# Patient Record
Sex: Female | Born: 1970 | Race: White | Hispanic: No | Smoking: Current every day smoker
Health system: Southern US, Community
[De-identification: ages and names within clinical notes are randomized; demographics above are authoritative.]

## PROBLEM LIST (undated history)

## (undated) HISTORY — PX: TUBAL LIGATION: SHX77

## (undated) HISTORY — PX: TONSILLECTOMY: SUR1361

---

## 2010-09-21 ENCOUNTER — Emergency Department (HOSPITAL_COMMUNITY)
Admission: EM | Admit: 2010-09-21 | Discharge: 2010-09-22 | Disposition: A | Discharge status: Home / Self Care | Payer: Self-pay | Attending: Emergency Medicine | Admitting: Emergency Medicine

## 2010-09-21 DIAGNOSIS — Z79899 Other long term (current) drug therapy: Secondary | ICD-10-CM | POA: Insufficient documentation

## 2010-09-21 DIAGNOSIS — N12 Tubulo-interstitial nephritis, not specified as acute or chronic: Secondary | ICD-10-CM | POA: Insufficient documentation

## 2010-09-21 LAB — CBC
HCT: 41.4 % (ref 36.0–46.0)
Hemoglobin: 13.8 g/dL (ref 12.0–15.0)
MCV: 89.2 fL (ref 78.0–100.0)
Platelets: 239 10*3/uL (ref 150–400)
RBC: 4.64 MIL/uL (ref 3.87–5.11)
WBC: 18.3 10*3/uL — ABNORMAL HIGH (ref 4.0–10.5)

## 2010-09-21 LAB — URINALYSIS, ROUTINE W REFLEX MICROSCOPIC
Bilirubin Urine: NEGATIVE
Glucose, UA: NEGATIVE mg/dL
Ketones, ur: 40 mg/dL — AB
Protein, ur: 30 mg/dL — AB
Urobilinogen, UA: 0.2 mg/dL (ref 0.0–1.0)

## 2010-09-21 LAB — DIFFERENTIAL
Eosinophils Absolute: 0 10*3/uL (ref 0.0–0.7)
Lymphocytes Relative: 7 % — ABNORMAL LOW (ref 12–46)
Lymphs Abs: 1.3 10*3/uL (ref 0.7–4.0)
Neutro Abs: 15.6 10*3/uL — ABNORMAL HIGH (ref 1.7–7.7)
Neutrophils Relative %: 85 % — ABNORMAL HIGH (ref 43–77)

## 2010-09-21 LAB — URINE MICROSCOPIC-ADD ON

## 2010-09-23 ENCOUNTER — Emergency Department (HOSPITAL_COMMUNITY)
Admission: EM | Admit: 2010-09-23 | Discharge: 2010-09-23 | Disposition: A | Discharge status: Home / Self Care | Payer: Self-pay | Attending: Emergency Medicine | Admitting: Emergency Medicine

## 2010-09-23 DIAGNOSIS — R509 Fever, unspecified: Secondary | ICD-10-CM | POA: Insufficient documentation

## 2010-09-23 DIAGNOSIS — R197 Diarrhea, unspecified: Secondary | ICD-10-CM | POA: Insufficient documentation

## 2010-09-23 DIAGNOSIS — R112 Nausea with vomiting, unspecified: Secondary | ICD-10-CM | POA: Insufficient documentation

## 2010-09-23 LAB — URINALYSIS, ROUTINE W REFLEX MICROSCOPIC
Glucose, UA: NEGATIVE mg/dL
Nitrite: POSITIVE — AB
Specific Gravity, Urine: 1.03 (ref 1.005–1.030)
pH: 5.5 (ref 5.0–8.0)

## 2010-09-23 LAB — URINE MICROSCOPIC-ADD ON

## 2010-09-23 LAB — DIFFERENTIAL
Basophils Absolute: 0 10*3/uL (ref 0.0–0.1)
Basophils Relative: 0 % (ref 0–1)
Eosinophils Relative: 0 % (ref 0–5)
Monocytes Absolute: 1.7 10*3/uL — ABNORMAL HIGH (ref 0.1–1.0)
Neutro Abs: 16.2 10*3/uL — ABNORMAL HIGH (ref 1.7–7.7)

## 2010-09-23 LAB — BASIC METABOLIC PANEL
Calcium: 10 mg/dL (ref 8.4–10.5)
GFR calc Af Amer: >60 mL/min (ref >60)
GFR calc non Af Amer: >60 mL/min (ref >60)
Sodium: 137 mEq/L (ref 135–145)

## 2010-09-23 LAB — CBC
Hemoglobin: 13.2 g/dL (ref 12.0–15.0)
MCHC: 33.4 g/dL (ref 30.0–36.0)
RDW: 13.7 % (ref 11.5–15.5)

## 2010-09-24 LAB — URINE CULTURE: Culture  Setup Time: 201205170111

## 2010-09-25 LAB — URINE CULTURE
Colony Count: >=100000
Culture  Setup Time: 201205180059

## 2017-08-25 ENCOUNTER — Encounter (HOSPITAL_COMMUNITY): Payer: Self-pay

## 2017-08-25 ENCOUNTER — Other Ambulatory Visit: Payer: Self-pay

## 2017-08-25 ENCOUNTER — Emergency Department (HOSPITAL_COMMUNITY)
Admission: EM | Admit: 2017-08-25 | Discharge: 2017-08-25 | Disposition: A | Discharge status: Home / Self Care | Payer: Self-pay | Attending: Emergency Medicine | Admitting: Emergency Medicine

## 2017-08-25 DIAGNOSIS — R300 Dysuria: Secondary | ICD-10-CM | POA: Insufficient documentation

## 2017-08-25 DIAGNOSIS — F17210 Nicotine dependence, cigarettes, uncomplicated: Secondary | ICD-10-CM | POA: Insufficient documentation

## 2017-08-25 DIAGNOSIS — R1030 Lower abdominal pain, unspecified: Secondary | ICD-10-CM | POA: Insufficient documentation

## 2017-08-25 DIAGNOSIS — R102 Pelvic and perineal pain: Secondary | ICD-10-CM

## 2017-08-25 LAB — URINALYSIS, ROUTINE W REFLEX MICROSCOPIC
Bacteria, UA: NONE SEEN
Bilirubin Urine: NEGATIVE
Glucose, UA: NEGATIVE mg/dL
KETONES UR: NEGATIVE mg/dL
Nitrite: NEGATIVE
PROTEIN: NEGATIVE mg/dL
Specific Gravity, Urine: 1.016 (ref 1.005–1.030)
pH: 6 (ref 5.0–8.0)

## 2017-08-25 LAB — PREGNANCY, URINE: PREG TEST UR: NEGATIVE

## 2017-08-25 MED ORDER — CEPHALEXIN 500 MG PO CAPS: 500.0000 mg | ORAL_CAPSULE | Freq: Two times a day (BID) | ORAL | 0 refills | Status: AC

## 2017-08-25 NOTE — ED Provider Notes (Signed)
[facility] EMERGENCY DEPARTMENT Provider Note   CSN: [id number] Arrival date & time: 08/25/17  1028     History   Chief Complaint Chief Complaint  Patient presents with  . Dysuria    HPI Audrey Rios is a 47 y.o. female presenting for evaluation of dysuria and lower abdominal cramping.  Patient states for the past 3 days, she has had dysuria and lower abdominal cramping.  Cramping began last week when she is having her period.  It is suprapubic.  It is intermittent, not severe.  She reports dysuria that began 3 days ago, as well as urinary frequency.  She denies hematuria.  She denies fevers, chills, nausea, vomiting, abdominal pain, flank pain.  She states this feels similar to when she had a UTI in the past.  Her UTIs have responded well to Keflex.  She denies other medical problems, does not take medications daily.  She denies vaginal discharge or concern for STDs.  HPI  History reviewed. No pertinent past medical history.  There are no active problems to display for this patient.   Past Surgical History:  Procedure Laterality Date  . TONSILLECTOMY    . TUBAL LIGATION       OB History   None      Home Medications    Prior to Admission medications   Medication Sig Start Date End Date Taking? Authorizing Provider  cephALEXin (KEFLEX) 500 MG capsule Take 1 capsule (500 mg total) by mouth 2 (two) times daily for 5 days. 08/25/17 08/30/17  Evolett Somarriba, PA-C    Family History No family history on file.  Social History Social History   Tobacco Use  . Smoking status: Current Every Day Smoker    Packs/day: 0.50    Types: Cigarettes  Substance Use Topics  . Alcohol use: Not Currently  . Drug use: Not Currently     Allergies   Erythromycin   Review of Systems Review of Systems  Constitutional: Negative for chills and fever.  Genitourinary: Positive for dysuria and frequency.     Physical Exam Updated Vital Signs BP (!) 175/87 (BP Location:  Right Arm)   Pulse 70   Temp 98.4 F (36.9 C) (Oral)   Resp 16   Ht 5\' 2"  (1.575 m)   Wt 92.1 kg (203 lb)   LMP 08/20/2017   SpO2 100%   BMI 37.13 kg/m   Physical Exam  Constitutional: She is oriented to person, place, and time. She appears well-developed and well-nourished. No distress.  HENT:  Head: Normocephalic and atraumatic.  Eyes: EOM are normal.  Neck: Normal range of motion.  Cardiovascular: Normal rate, regular rhythm and intact distal pulses.  Pulmonary/Chest: Effort normal and breath sounds normal. No respiratory distress. She has no wheezes.  Abdominal: Soft. She exhibits no distension and no mass. There is no tenderness. There is no rebound and no guarding.  No tenderness to palpation of the abdomen.  Soft without rigidity, guarding, or distention.  Musculoskeletal: Normal range of motion.  Neurological: She is alert and oriented to person, place, and time.  Skin: Skin is warm. No rash noted.  Psychiatric: She has a normal mood and affect.  Nursing note and vitals reviewed.    ED Treatments / Results  Labs (all labs ordered are listed, but only abnormal results are displayed) Labs Reviewed  URINALYSIS, ROUTINE W REFLEX MICROSCOPIC - Abnormal; Notable for the following components:      Result Value   APPearance CLOUDY (*)  Hgb urine dipstick LARGE (*)    Leukocytes, UA LARGE (*)    Squamous Epithelial / LPF 6-30 (*)    Non Squamous Epithelial 0-5 (*)    All other components within normal limits  PREGNANCY, URINE    EKG None  Radiology No results found.  Procedures Procedures (including critical care time)  Medications Ordered in ED Medications - No data to display   Initial Impression / Assessment and Plan / ED Course  I have reviewed the triage vital signs and the nursing notes.  Pertinent labs & imaging results that were available during my care of the patient were reviewed by me and considered in my medical decision making (see chart for  details).     Patient presenting for evaluation of dysuria and suprapubic cramping.  Physical exam reassuring, patient is afebrile not tachycardic.  She appears nontoxic.  Urine positive for leuks.  Patient without vaginal discharge, does not want to be tested for STDs or other vaginal infections today.  As urine is possibly positive for UTI, and patient is with symptoms, will treat with Keflex.  Doubt torsion, PID, or intra-abdominal infection.  Discussed follow-up with health department if symptoms are not improving with antibiotics.  At this time, patient appears safe for discharge.  Return precautions given.  Patient states she understands and agrees to plan.   Final Clinical Impressions(s) / ED Diagnoses   Final diagnoses:  Dysuria  Suprapubic cramping    ED Discharge Orders        Ordered    cephALEXin (KEFLEX) 500 MG capsule  2 times daily     08/25/17 1209       Akari Crysler, PA-C 08/25/17 1245    Bethann Berkshire, MD 08/29/17 0730

## 2017-08-25 NOTE — Discharge Instructions (Addendum)
Take antibiotics as prescribed.  Take the entire course, even if your symptoms improve. Make sure you are staying well-hydrated with water.  Try to decrease her caffeine intake. Use Tylenol or ibuprofen as needed for pain or cramping.  You may also try heating pads for cramping. Follow-up with health department or at urgent care if your symptoms are not improving with medication. Return to the emergency room if you develop high fevers, persistent vomiting, severe pain, vaginal bleeding, or any new or concerning symptoms.

## 2017-08-25 NOTE — ED Triage Notes (Signed)
Pt reports painful urination at the completion of urination.This began yesterday. Prior to this pt had abd cramps but attributed this to being on her period. Denies blood or vaginal discharge

## 2018-11-21 ENCOUNTER — Other Ambulatory Visit: Payer: Self-pay

## 2018-11-21 ENCOUNTER — Ambulatory Visit
Admission: EM | Admit: 2018-11-21 | Discharge: 2018-11-21 | Disposition: A | Discharge status: Home / Self Care | Payer: Self-pay | Attending: Emergency Medicine | Admitting: Emergency Medicine

## 2018-11-21 ENCOUNTER — Ambulatory Visit (INDEPENDENT_AMBULATORY_CARE_PROVIDER_SITE_OTHER): Payer: Self-pay

## 2018-11-21 DIAGNOSIS — R109 Unspecified abdominal pain: Secondary | ICD-10-CM

## 2018-11-21 DIAGNOSIS — R062 Wheezing: Secondary | ICD-10-CM

## 2018-11-21 DIAGNOSIS — S29012A Strain of muscle and tendon of back wall of thorax, initial encounter: Secondary | ICD-10-CM

## 2018-11-21 DIAGNOSIS — R51 Headache: Secondary | ICD-10-CM

## 2018-11-21 DIAGNOSIS — F17210 Nicotine dependence, cigarettes, uncomplicated: Secondary | ICD-10-CM

## 2018-11-21 DIAGNOSIS — R05 Cough: Secondary | ICD-10-CM

## 2018-11-21 DIAGNOSIS — R319 Hematuria, unspecified: Secondary | ICD-10-CM

## 2018-11-21 DIAGNOSIS — R0789 Other chest pain: Secondary | ICD-10-CM

## 2018-11-21 LAB — POCT URINALYSIS DIP (MANUAL ENTRY)
Bilirubin, UA: NEGATIVE
Glucose, UA: NEGATIVE mg/dL
Ketones, POC UA: NEGATIVE mg/dL
Leukocytes, UA: NEGATIVE
Nitrite, UA: NEGATIVE
Protein Ur, POC: NEGATIVE mg/dL
Spec Grav, UA: 1.025 (ref 1.010–1.025)
Urobilinogen, UA: 0.2 E.U./dL
pH, UA: 5.5 (ref 5.0–8.0)

## 2018-11-21 MED ORDER — CYCLOBENZAPRINE HCL 10 MG PO TABS: 10.0000 mg | ORAL_TABLET | Freq: Every day | ORAL | 0 refills | Status: AC

## 2018-11-21 NOTE — Discharge Instructions (Signed)
Chest x-ray showed possible bronchitis, but no pneumonia, fluid in the lungs, or collapse lung. Patient declines prednisone and inhaler today Urine did not show signs of infection just trace blood.  This could be a sign of kidney stone.  We will send urine out to culture and notify you of abnormal results.   Symptoms most likely musculoskeletal in nature.   Continue conservative management of rest, ice, heat, and gentle stretches Take OTC ibuprofen 600-800mg  twice daily.  You may take tylenol in between for break-through pain Take cyclobenzaprine at nighttime for symptomatic relief. Avoid driving or operating heavy machinery while using medication. Follow up with PCP if symptoms persist.  I have attached a list of local PCPs in the area that you may follow up with if you do not already have one.   Return or go to the ER if you have any new or worsening symptoms (fever, chills, cough, shortness of breath, chest pain, abdominal pain, changes in bowel or bladder habits, blood in urine, loss of bowel or bladder function, etc...)

## 2018-11-21 NOTE — ED Triage Notes (Signed)
Pt has had left sided flank pain since Monday and also c/o headache

## 2018-11-21 NOTE — ED Provider Notes (Signed)
[place]   [id number] 11/21/18 Arrival Time: 12  CC: LT flank pain  SUBJECTIVE: History from: patient. Brooklinn S Krog is a 48 y.o. female who presents with complaint of left flank pain x 2 days.  Does admit to lifting up grand child who is 63 years old, 2-3 days ago.  Localizes pain to left flank.  Describes as intermittent, dull and sharp in character.  Has tried OTC medications like tylenol and goody powder without relief.  Symptoms made worse with standing in certain positions.  Complains of sinus HA, and mild cough. Denies fever, chills, rhinorrhea, congestion, sore throat, SOB, chest pressure, erythema, ecchymosis, effusion, abdominal pain, changes in bowel or bladder function.      Admits to smoking tobacco.  1PPD x 30+ years  ROS: As per HPI.  History reviewed. No pertinent past medical history. Past Surgical History:  Procedure Laterality Date  . TONSILLECTOMY    . TUBAL LIGATION     Allergies  Allergen Reactions  . Erythromycin    No current facility-administered medications on file prior to encounter.    No current outpatient medications on file prior to encounter.   Social History   Socioeconomic History  . Marital status: Legally Separated    Spouse name: Not on file  . Number of children: Not on file  . Years of education: Not on file  . Highest education level: Not on file  Occupational History  . Not on file  Social Needs  . Financial resource strain: Not on file  . Food insecurity    Worry: Not on file    Inability: Not on file  . Transportation needs    Medical: Not on file    Non-medical: Not on file  Tobacco Use  . Smoking status: Current Every Day Smoker    Packs/day: 0.50    Types: Cigarettes  Substance and Sexual Activity  . Alcohol use: Not Currently  . Drug use: Not Currently  . Sexual activity: Not on file  Lifestyle  . Physical activity    Days per week: Not on file    Minutes per session: Not on file  . Stress: Not on  file  Relationships  . Social Herbalist on phone: Not on file    Gets together: Not on file    Attends religious service: Not on file    Active member of club or organization: Not on file    Attends meetings of clubs or organizations: Not on file    Relationship status: Not on file  . Intimate partner violence    Fear of current or ex partner: Not on file    Emotionally abused: Not on file    Physically abused: Not on file    Forced sexual activity: Not on file  Other Topics Concern  . Not on file  Social History Narrative  . Not on file   History reviewed. No pertinent family history.  OBJECTIVE:  Vitals:   11/21/18 1323  BP: (!) 143/89  Pulse: 71  Resp: 16  Temp: 98.6 F (37 C)  SpO2: 97%    General appearance: ALERT; in no acute distress.  Head: NCAT ENT: PERRL, EOMI grossly; nares patent without rhinorrhea; oropharynx clear Lungs: Diffuse expiratory wheezes heard bilaterally, but more prominent over left lung field CV: RRR Musculoskeletal: Back Inspection: Skin warm, dry, clear and intact without obvious erythema, effusion, or ecchymosis.  Palpation: NTTP over midline or paravertebral muscles; no CVA tenderness; unable to  reproduce symptoms on exam ROM: FROM active and passive Skin: warm and dry Neurologic: Ambulates without difficulty Psychological: alert and cooperative; normal mood and affect  DIAGNOSTIC STUDIES:  Dg Chest 2 View  Result Date: 11/21/2018 CLINICAL DATA:  Left lower posterior chest pain over the last 3 days. Wheezing and cough. EXAM: CHEST - 2 VIEW COMPARISON:  CT 06/22/2015 FINDINGS: Heart size is normal. Mediastinal shadows are normal. Question mild bronchial thickening but no infiltrate, mass, effusion or collapse. No significant bone finding. IMPRESSION: Possible bronchitis.  No consolidation or collapse. Electronically Signed   By: Paulina FusiMark  Shogry M.D.   On: 11/21/2018 14:25    LABS:  Results for orders placed or performed during  the hospital encounter of 11/21/18 (from the past 24 hour(s))  POCT urinalysis dipstick     Status: Abnormal   Collection Time: 11/21/18  1:35 PM  Result Value Ref Range   Color, UA yellow yellow   Clarity, UA clear clear   Glucose, UA negative negative mg/dL   Bilirubin, UA negative negative   Ketones, POC UA negative negative mg/dL   Spec Grav, UA [id number] - 1.025   Blood, UA trace-intact (A) negative   pH, UA 5.5 5.0 - 8.0   Protein Ur, POC negative negative mg/dL   Urobilinogen, UA 0.2 0.2 or 1.0 E.U./dL   Nitrite, UA Negative Negative   Leukocytes, UA Negative Negative     ASSESSMENT & PLAN:  1. Left flank pain   2. Wheezes   3. Hematuria, unspecified type   4. Strain of muscle and tendon of back wall of thorax, initial encounter     Meds ordered this encounter  Medications  . cyclobenzaprine (FLEXERIL) 10 MG tablet    Sig: Take 1 tablet (10 mg total) by mouth at bedtime.    Dispense:  12 tablet    Refill:  0    Order Specific Question:   Supervising Provider    Answer:   Eustace MooreELSON, YVONNE SUE [[id number]]   Chest x-ray showed possible bronchitis, but no pneumonia, fluid in the lungs, or collapse lung. Patient declines prednisone and inhaler today Urine did not show signs of infection just trace blood.  This could be a sign of kidney stone.  We will send urine out to culture and notify you of abnormal results.   Symptoms most likely musculoskeletal in nature.   Continue conservative management of rest, ice, heat, and gentle stretches Take OTC ibuprofen 600-800mg  twice daily.  You may take tylenol in between for break-through pain Take cyclobenzaprine at nighttime for symptomatic relief. Avoid driving or operating heavy machinery while using medication. Follow up with PCP if symptoms persist.  I have attached a list of local PCPs in the area that you may follow up with if you do not already have one.   Return or go to the ER if you have any new or worsening symptoms  (fever, chills, cough, shortness of breath, chest pain, abdominal pain, changes in bowel or bladder habits, blood in urine, loss of bowel or bladder function, etc...)   Reviewed expectations re: course of current medical issues. Questions answered. Outlined signs and symptoms indicating need for more acute intervention. Patient verbalized understanding. After Visit Summary given.    Rennis HardingWurst, Skylen Danielsen, PA-C 11/21/18 1443

## 2019-10-22 ENCOUNTER — Encounter: Payer: Self-pay | Admitting: Emergency Medicine

## 2019-10-22 ENCOUNTER — Ambulatory Visit
Admission: EM | Admit: 2019-10-22 | Discharge: 2019-10-22 | Disposition: A | Discharge status: Home / Self Care | Payer: Self-pay | Attending: Emergency Medicine | Admitting: Emergency Medicine

## 2019-10-22 ENCOUNTER — Other Ambulatory Visit: Payer: Self-pay

## 2019-10-22 DIAGNOSIS — R062 Wheezing: Secondary | ICD-10-CM

## 2019-10-22 DIAGNOSIS — H00012 Hordeolum externum right lower eyelid: Secondary | ICD-10-CM

## 2019-10-22 DIAGNOSIS — H00015 Hordeolum externum left lower eyelid: Secondary | ICD-10-CM

## 2019-10-22 DIAGNOSIS — J309 Allergic rhinitis, unspecified: Secondary | ICD-10-CM

## 2019-10-22 DIAGNOSIS — R059 Cough, unspecified: Secondary | ICD-10-CM

## 2019-10-22 DIAGNOSIS — R05 Cough: Secondary | ICD-10-CM

## 2019-10-22 MED ORDER — PREDNISONE 10 MG PO TABS: 20.0000 mg | ORAL_TABLET | Freq: Every day | ORAL | 0 refills | Status: DC

## 2019-10-22 MED ORDER — FLUTICASONE PROPIONATE 50 MCG/ACT NA SUSP: 1.0000 | Freq: Every day | NASAL | 0 refills | Status: AC

## 2019-10-22 MED ORDER — OFLOXACIN 0.3 % OT SOLN: 5.0000 [drp] | Freq: Every day | OTIC | 0 refills | Status: DC

## 2019-10-22 MED ORDER — CETIRIZINE HCL 10 MG PO TABS: 10.0000 mg | ORAL_TABLET | Freq: Every day | ORAL | 0 refills | Status: AC

## 2019-10-22 MED ORDER — BENZONATATE 100 MG PO CAPS: 100.0000 mg | ORAL_CAPSULE | Freq: Three times a day (TID) | ORAL | 0 refills | Status: DC

## 2019-10-22 NOTE — ED Triage Notes (Addendum)
Cough, nasal congestion and sinus pressure x 2 weeks. Swelling under LT eye, pt reports she think it is a stye.  No fever.  Pt states she has trouble with allergies as well.

## 2019-10-22 NOTE — Discharge Instructions (Addendum)
Get plenty of rest and push fluids Tessalon Perles prescribed for cough zyrtec for nasal congestion, runny nose, and/or sore throat flonase for nasal congestion and runny nose Prednisone prescribed for wheezing Use medications daily for symptom relief Use OTC medications like ibuprofen or tylenol as needed fever or pain Call or go to the ED if you have any new or worsening symptoms such as fever, worsening cough, shortness of breath, chest tightness, chest pain, turning blue, changes in mental status, etc...   STYE: Continue warm compresses at home.  Soak a wash cloth in warm (not scalding) water and place it over the eyes. As the wash cloth cools, it should be rewarmed and replaced for a total of 5 to 10 minutes of soaking time. Warm compresses should be applied two to four times a day as long as the patient has symptoms Perform lid washing: Either warm water or very dilute baby shampoo can be placed on a clean wash cloth, gauze pad, or cotton swab. Then be advised to gently clean along the lashes and lid margin to remove the accumulated material with care to avoid contacting the ocular surface. If shampoo is used, thorough rinsing is recommended. Vigorous washing should be avoided, as it may cause more irritation.  Prescribed for Ofloxacin eyedrops.  Apply  until symptomatic improvement

## 2019-10-22 NOTE — ED Provider Notes (Addendum)
[place]   [id number] 10/22/19 Arrival Time: 0916   Chief Complaint  Patient presents with  . Facial Pain     SUBJECTIVE: History from: patient.  Audrey Rios is a 49 y.o. female who presents to the urgent care for complaint of cough, nasal congestion and sinus pressure and clear nasal dissharge for the past 2 weeks.  Denies sick exposure to COVID, flu or strep.  Denies recent travel.  Has tried OTC medication without relief.  Symptoms are made worse with lying down.  Report previous symptoms in the past.   Denies fever, chills, fatigue,  sore throat, SOB, wheezing, chest pain, nausea, changes in bowel or bladder habits.    She is also complaining of a stye in the left eye.  Received flu shot this year: no.  ROS: As per HPI.  All other pertinent ROS negative.     History reviewed. No pertinent past medical history. Past Surgical History:  Procedure Laterality Date  . TONSILLECTOMY    . TUBAL LIGATION     Allergies  Allergen Reactions  . Erythromycin    No current facility-administered medications on file prior to encounter.   Current Outpatient Medications on File Prior to Encounter  Medication Sig Dispense Refill  . cyclobenzaprine (FLEXERIL) 10 MG tablet Take 1 tablet (10 mg total) by mouth at bedtime. 12 tablet 0   Social History   Socioeconomic History  . Marital status: Legally Separated    Spouse name: Not on file  . Number of children: Not on file  . Years of education: Not on file  . Highest education level: Not on file  Occupational History  . Not on file  Tobacco Use  . Smoking status: Current Every Day Smoker    Packs/day: 0.50    Types: Cigarettes  . Smokeless tobacco: Never Used  Substance and Sexual Activity  . Alcohol use: Not Currently  . Drug use: Not Currently  . Sexual activity: Not on file  Other Topics Concern  . Not on file  Social History Narrative  . Not on file   Social Determinants of Health   Financial  Resource Strain:   . Difficulty of Paying Living Expenses:   Food Insecurity:   . Worried About Charity fundraiser in the Last Year:   . Arboriculturist in the Last Year:   Transportation Needs:   . Film/video editor (Medical):   Marland Kitchen Lack of Transportation (Non-Medical):   Physical Activity:   . Days of Exercise per Week:   . Minutes of Exercise per Session:   Stress:   . Feeling of Stress :   Social Connections:   . Frequency of Communication with Friends and Family:   . Frequency of Social Gatherings with Friends and Family:   . Attends Religious Services:   . Active Member of Clubs or Organizations:   . Attends Archivist Meetings:   Marland Kitchen Marital Status:   Intimate Partner Violence:   . Fear of Current or Ex-Partner:   . Emotionally Abused:   Marland Kitchen Physically Abused:   . Sexually Abused:    Family History  Problem Relation Age of Onset  . Cancer Mother   . Hypertension Father     OBJECTIVE:  Vitals:   10/22/19 0926 10/22/19 0929  BP:  (!) 160/69  Pulse:  81  Resp:  17  Temp:  98.7 F (37.1 C)  TempSrc:  Oral  SpO2:  94%  Weight: 183 lb (  83 kg)   Height: 5\' 2"  (1.575 m)      Physical Exam Vitals and nursing note reviewed.  Constitutional:      General: She is not in acute distress.    Appearance: Normal appearance. She is normal weight. She is not ill-appearing, toxic-appearing or diaphoretic.  HENT:     Head: Normocephalic.     Right Ear: Tympanic membrane, ear canal and external ear normal. There is no impacted cerumen.     Left Ear: Tympanic membrane, ear canal and external ear normal. There is no impacted cerumen.     Nose: Congestion and rhinorrhea present.     Right Sinus: Maxillary sinus tenderness present.     Left Sinus: Maxillary sinus tenderness present.     Mouth/Throat:     Mouth: Mucous membranes are moist.     Pharynx: Oropharynx is clear. No oropharyngeal exudate or posterior oropharyngeal erythema.  Eyes:     General: Lids are  normal. Vision grossly intact. Gaze aligned appropriately.        Right eye: No hordeolum.        Left eye: Hordeolum present. Cardiovascular:     Rate and Rhythm: Normal rate and regular rhythm.     Pulses: Normal pulses.     Heart sounds: Normal heart sounds. No murmur heard.  No friction rub. No gallop.   Pulmonary:     Effort: Pulmonary effort is normal. No respiratory distress.     Breath sounds: No stridor. Wheezing present. No rhonchi or rales.  Chest:     Chest wall: No tenderness.  Neurological:     Mental Status: She is alert and oriented to person, place, and time.     LABS:  No results found for this or any previous visit (from the past 24 hour(s)).   ASSESSMENT & PLAN:  1. Allergic sinusitis   2. Wheezing   3. Cough   4. Hordeolum externum of left lower eyelid     Meds ordered this encounter  Medications  . fluticasone (FLONASE) 50 MCG/ACT nasal spray    Sig: Place 1 spray into both nostrils daily for 14 days.    Dispense:  16 g    Refill:  0  . cetirizine (ZYRTEC ALLERGY) 10 MG tablet    Sig: Take 1 tablet (10 mg total) by mouth daily.    Dispense:  30 tablet    Refill:  0  . predniSONE (DELTASONE) 10 MG tablet    Sig: Take 2 tablets (20 mg total) by mouth daily.    Dispense:  15 tablet    Refill:  0  . benzonatate (TESSALON) 100 MG capsule    Sig: Take 1 capsule (100 mg total) by mouth every 8 (eight) hours.    Dispense:  30 capsule    Refill:  0  . ofloxacin (FLOXIN) 0.3 % OTIC solution    Sig: Place 5 drops into the right ear daily.    Dispense:  5 mL    Refill:  0   Discharge instructions  Get plenty of rest and push fluids Tessalon Perles prescribed for cough zyrtec for nasal congestion, runny nose, and/or sore throat flonase for nasal congestion and runny nose Prednisone prescribed for wheezing Use medications daily for symptom relief Use OTC medications like ibuprofen or tylenol as needed fever or pain Call or go to the ED if you have  any new or worsening symptoms such as fever, worsening cough, shortness of breath, chest tightness, chest pain, turning  blue, changes in mental status, etc...   STYE: Continue warm compresses at home.  Soak a wash cloth in warm (not scalding) water and place it over the eyes. As the wash cloth cools, it should be rewarmed and replaced for a total of 5 to 10 minutes of soaking time. Warm compresses should be applied two to four times a day as long as the patient has symptoms Perform lid washing: Either warm water or very dilute baby shampoo can be placed on a clean wash cloth, gauze pad, or cotton swab. Then be advised to gently clean along the lashes and lid margin to remove the accumulated material with care to avoid contacting the ocular surface. If shampoo is used, thorough rinsing is recommended. Vigorous washing should be avoided, as it may cause more irritation.  Prescribed for Ofloxacin eyedrops.  Apply  until symptomatic improvement   Reviewed expectations re: course of current medical issues. Questions answered. Outlined signs and symptoms indicating need for more acute intervention. Patient verbalized understanding. After Visit Summary given.    Note: This document was prepared using Dragon voice recognition software and may include unintentional dictation errors.        Durward Parcel, FNP 10/22/19 0948    Durward Parcel, FNP 10/22/19 0954    Durward Parcel, FNP 10/22/19 [phone]

## 2020-11-25 IMAGING — DX CHEST - 2 VIEW
2 series · 2 of 2 positions shown · non-contrast
Comparison: CT 06/22/2015

CLINICAL DATA: Left lower posterior chest pain over the last 3
days. Wheezing and cough.

EXAM:
CHEST - 2 VIEW

[chest pa]
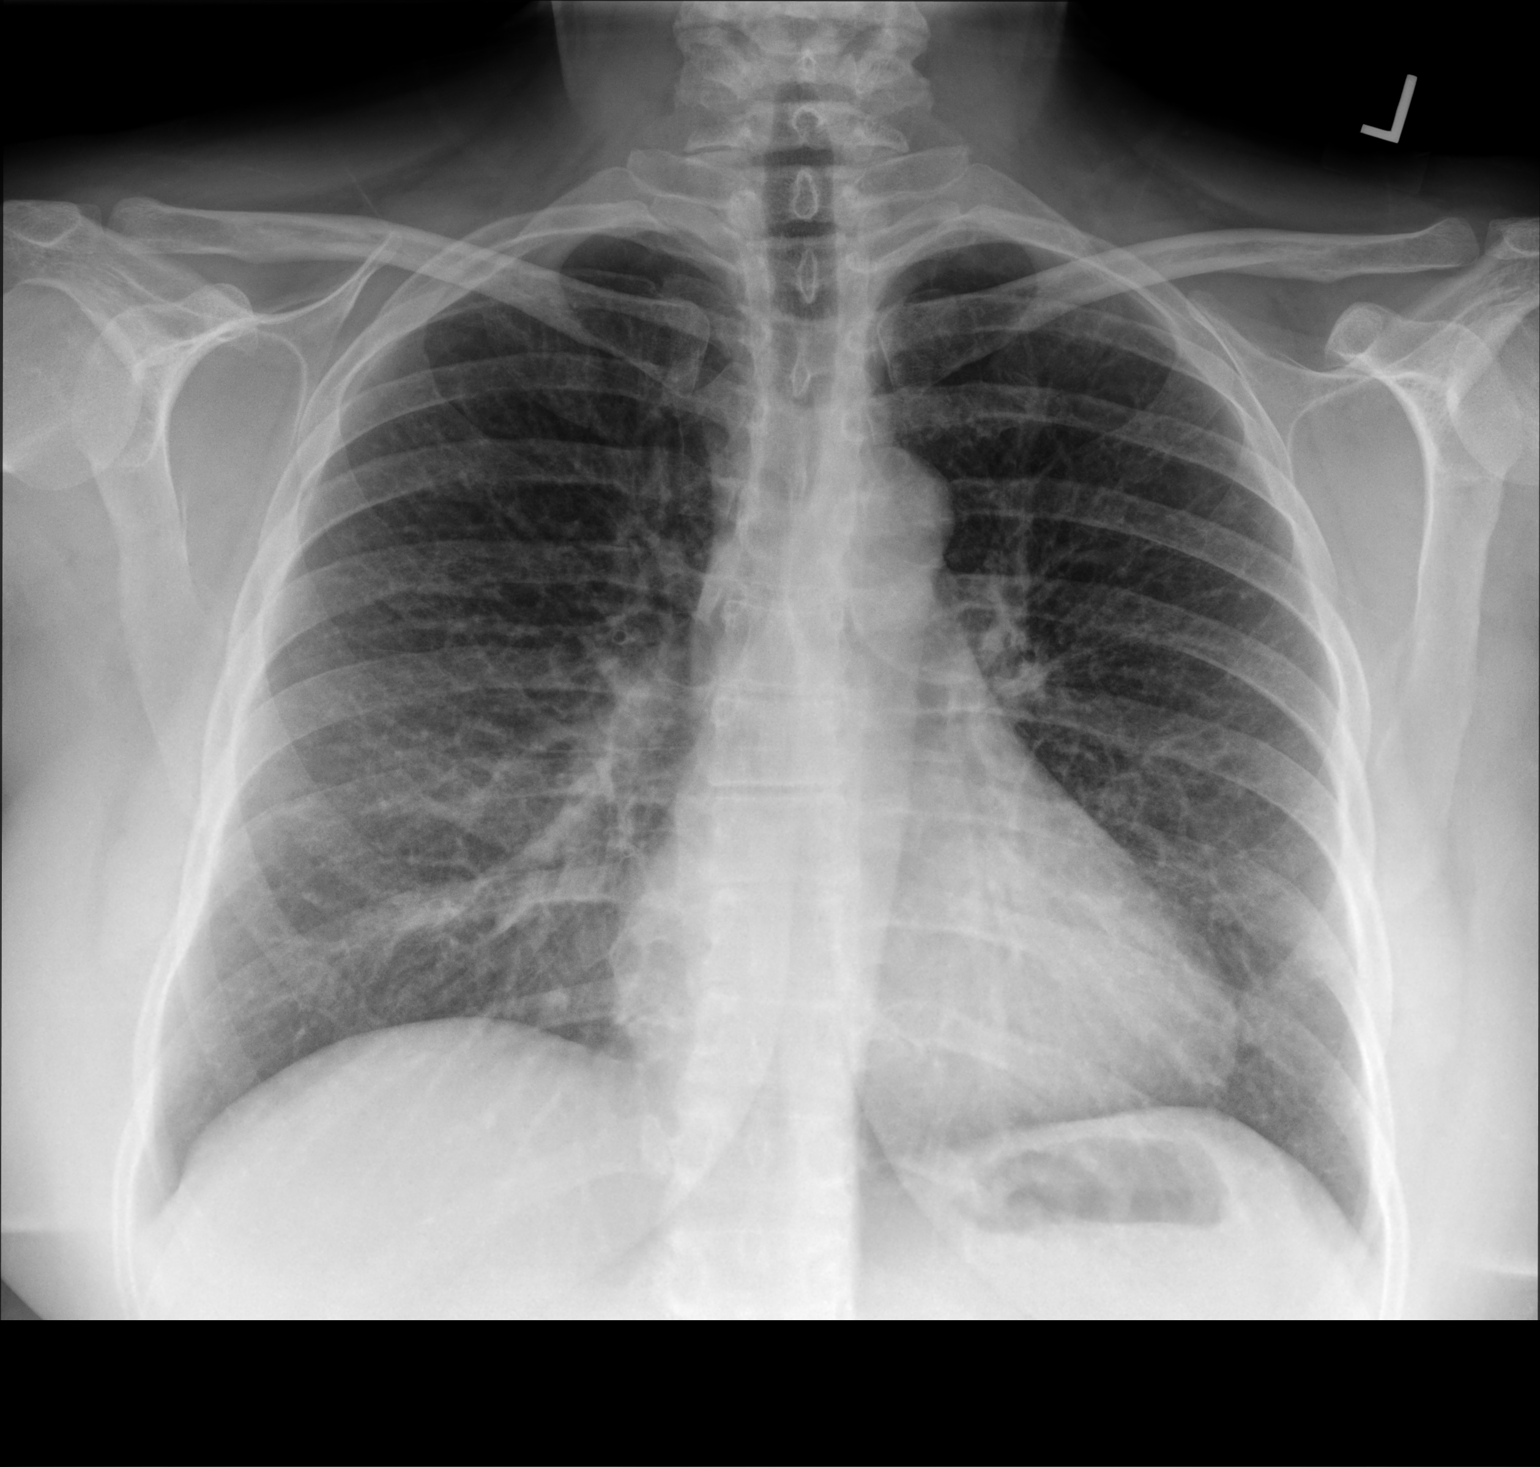

[chest lat]
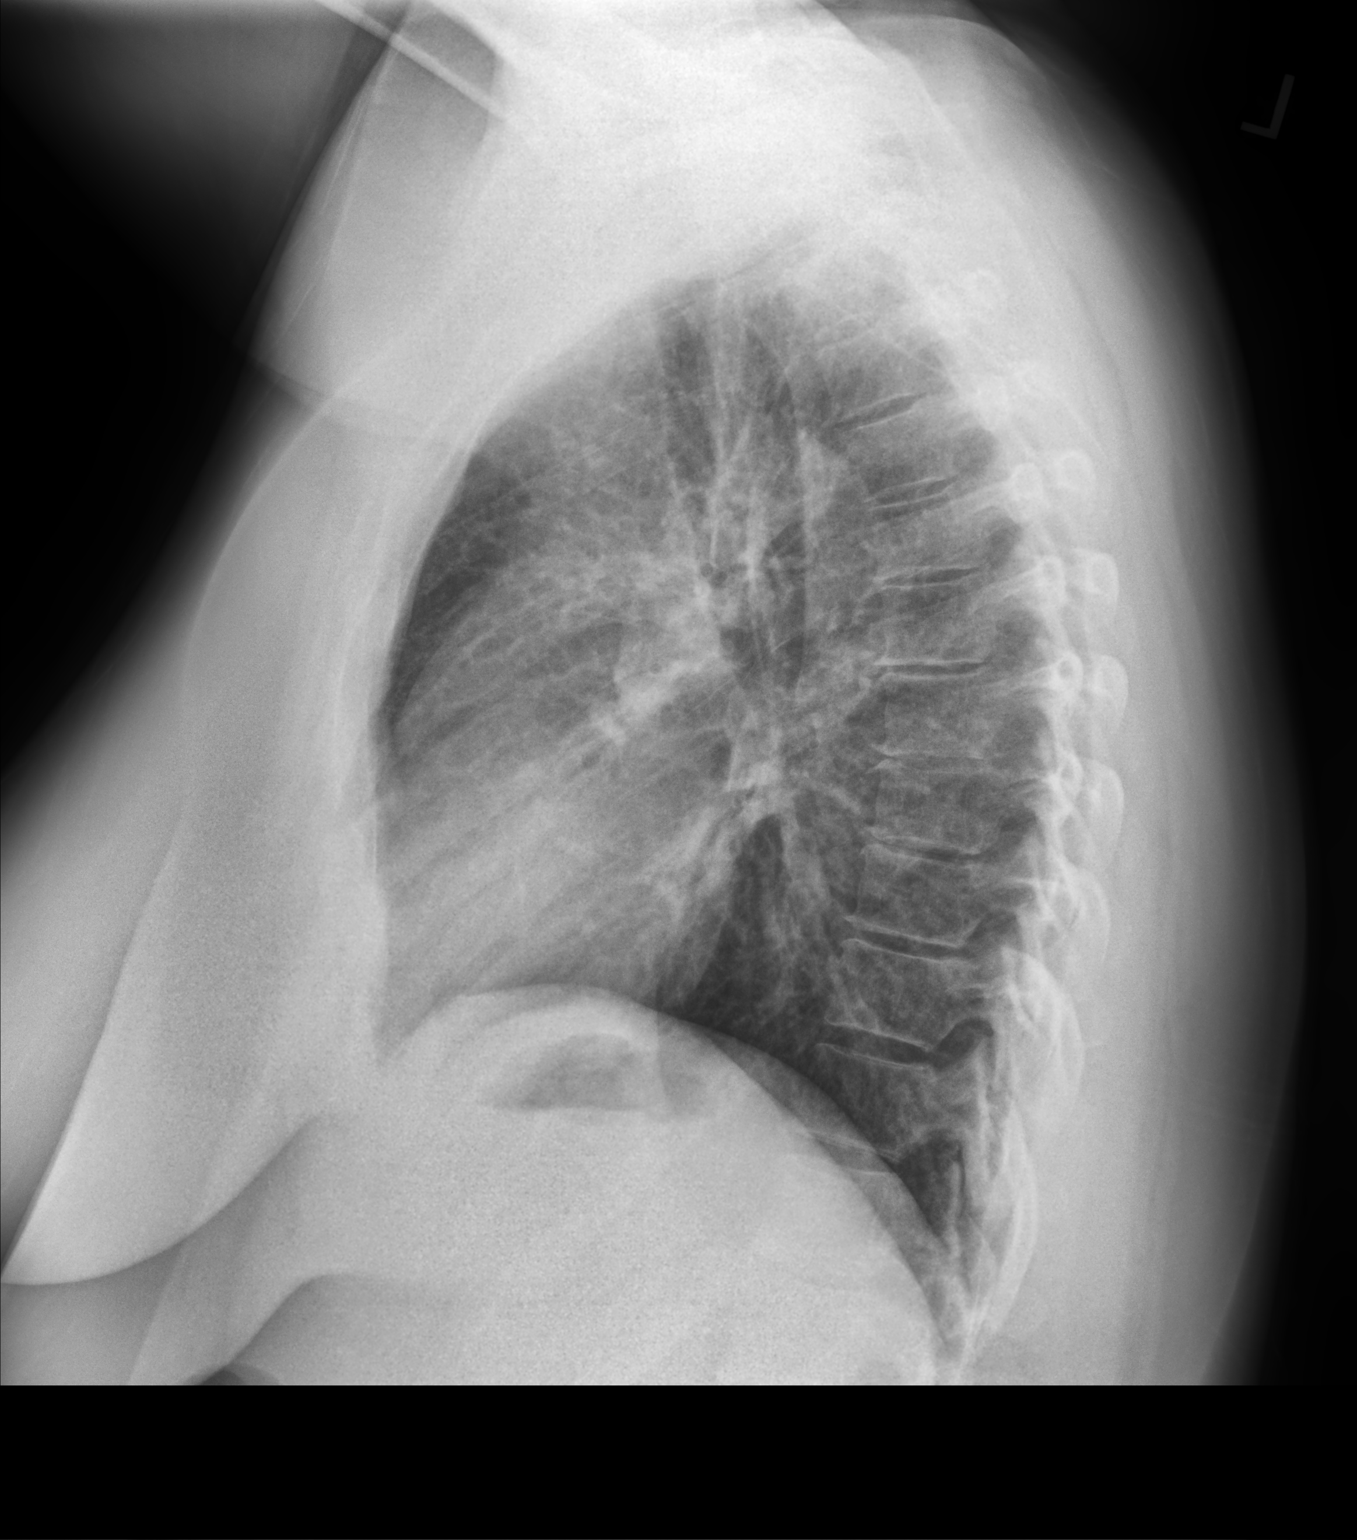

[2 of 2 positions shown; findings below may reference images not displayed]

FINDINGS: Heart size is normal. Mediastinal shadows are normal. Question mild
bronchial thickening but no infiltrate, mass, effusion or collapse.
No significant bone finding.
IMPRESSION: Possible bronchitis.  No consolidation or collapse.

## 2020-12-17 ENCOUNTER — Other Ambulatory Visit: Payer: Self-pay

## 2020-12-17 ENCOUNTER — Ambulatory Visit
Admission: RE | Admit: 2020-12-17 | Discharge: 2020-12-17 | Disposition: A | Discharge status: Home / Self Care | Payer: Self-pay | Source: Ambulatory Visit | Attending: Emergency Medicine | Admitting: Emergency Medicine

## 2020-12-17 VITALS — BP 154/80 | HR 87 | Temp 98.1°F | Resp 16

## 2020-12-17 DIAGNOSIS — R300 Dysuria: Secondary | ICD-10-CM

## 2020-12-17 LAB — POCT URINALYSIS DIP (MANUAL ENTRY)
Bilirubin, UA: NEGATIVE
Glucose, UA: NEGATIVE mg/dL
Ketones, POC UA: NEGATIVE mg/dL
Leukocytes, UA: NEGATIVE
Nitrite, UA: NEGATIVE
Protein Ur, POC: NEGATIVE mg/dL
Spec Grav, UA: 1.02 (ref 1.010–1.025)
Urobilinogen, UA: 0.2 E.U./dL
pH, UA: 5.5 (ref 5.0–8.0)

## 2020-12-17 MED ORDER — PHENAZOPYRIDINE HCL 200 MG PO TABS: 200.0000 mg | ORAL_TABLET | Freq: Three times a day (TID) | ORAL | 0 refills | Status: AC

## 2020-12-17 NOTE — Discharge Instructions (Signed)
Urine without obvious signs of UTI Urine culture sent.  We will call you with the results.   Push fluids and get plenty of rest.   Take pyridium as prescribed and as needed for symptomatic relief Follow up with PCP if symptoms persists Return here or go to ER if you have any new or worsening symptoms such as fever, worsening abdominal pain, nausea/vomiting, flank pain, etc..Marland Kitchen

## 2020-12-17 NOTE — ED Triage Notes (Signed)
Pain on urination x 3 days.  Lower abd pain.

## 2020-12-17 NOTE — ED Provider Notes (Signed)
[facility]   CC: Burning with urination  SUBJECTIVE:  Audrey Rios is a 50 y.o. female who complains of painful urination x 3 days.  Patient denies a precipitating event, recent sexual encounter, excessive caffeine intake.  Denies pain.  Has tried alleviating factors without relief.  Symptoms are made worse with urination.  Admits to similar symptoms in the past with bladder infection.  Denies fever, chills, nausea, vomiting, abdominal pain, flank pain, abnormal vaginal discharge/ itchy/ burning/ odor, hematuria.    LMP: Patient's last menstrual period was 12/09/2020 (exact date).  ROS: As in HPI.  All other pertinent ROS negative.     History reviewed. No pertinent past medical history. Past Surgical History:  Procedure Laterality Date   TONSILLECTOMY     TUBAL LIGATION     Allergies  Allergen Reactions   Erythromycin    No current facility-administered medications on file prior to encounter.   Current Outpatient Medications on File Prior to Encounter  Medication Sig Dispense Refill   cetirizine (ZYRTEC ALLERGY) 10 MG tablet Take 1 tablet (10 mg total) by mouth daily. 30 tablet 0   cyclobenzaprine (FLEXERIL) 10 MG tablet Take 1 tablet (10 mg total) by mouth at bedtime. 12 tablet 0   fluticasone (FLONASE) 50 MCG/ACT nasal spray Place 1 spray into both nostrils daily for 14 days. 16 g 0   Social History   Socioeconomic History   Marital status: Legally Separated    Spouse name: Not on file   Number of children: Not on file   Years of education: Not on file   Highest education level: Not on file  Occupational History   Not on file  Tobacco Use   Smoking status: Every Day    Packs/day: 0.50    Types: Cigarettes   Smokeless tobacco: Never  Substance and Sexual Activity   Alcohol use: Not Currently   Drug use: Not Currently   Sexual activity: Not on file  Other Topics Concern   Not on file  Social History Narrative   Not on file   Social Determinants  of Health   Financial Resource Strain: Not on file  Food Insecurity: Not on file  Transportation Needs: Not on file  Physical Activity: Not on file  Stress: Not on file  Social Connections: Not on file  Intimate Partner Violence: Not on file   Family History  Problem Relation Age of Onset   Cancer Mother    Hypertension Father     OBJECTIVE:  Vitals:   12/17/20 1301  BP: (!) 154/80  Pulse: 87  Resp: 16  Temp: 98.1 F (36.7 C)  TempSrc: Oral  SpO2: 97%   General appearance: AOx3 in no acute distress HEENT: NCAT.  Oropharynx clear.  Lungs: clear to auscultation bilaterally without adventitious breath sounds Heart: regular rate and rhythm.   Abdomen: soft; non-distended; no tenderness; bowel sounds present; no guarding or rebound tenderness Back: no CVA tenderness Extremities: no edema; symmetrical with no gross deformities Skin: warm and dry Neurologic: Ambulates from chair to exam table without difficulty Psychological: alert and cooperative; normal mood and affect  Labs Reviewed  POCT URINALYSIS DIP (MANUAL ENTRY) - Abnormal; Notable for the following components:      Result Value   Blood, UA trace-lysed (*)    All other components within normal limits  URINE CULTURE    ASSESSMENT & PLAN:  1. Dysuria     Meds ordered this encounter  Medications   phenazopyridine (PYRIDIUM) 200 MG  tablet    Sig: Take 1 tablet (200 mg total) by mouth 3 (three) times daily.    Dispense:  6 tablet    Refill:  0    Order Specific Question:   Supervising Provider    Answer:   Eustace Moore [[id number]]   Urine without obvious signs of UTI Urine culture sent.  We will call you with the results.   Push fluids and get plenty of rest.   Take pyridium as prescribed and as needed for symptomatic relief Follow up with PCP if symptoms persists Return here or go to ER if you have any new or worsening symptoms such as fever, worsening abdominal pain, nausea/vomiting, flank pain,  etc...  Outlined signs and symptoms indicating need for more acute intervention. Patient verbalized understanding. After Visit Summary given.      Rennis Harding, PA-C 12/17/20 1355

## 2020-12-20 LAB — URINE CULTURE: Culture: 10000 — AB

## 2020-12-21 ENCOUNTER — Telehealth (HOSPITAL_COMMUNITY): Payer: Self-pay | Admitting: Emergency Medicine

## 2020-12-21 MED ORDER — NITROFURANTOIN MONOHYD MACRO 100 MG PO CAPS: 100.0000 mg | ORAL_CAPSULE | Freq: Two times a day (BID) | ORAL | 0 refills | Status: AC
